# Patient Record
Sex: Female | Born: 1990 | Race: White | Hispanic: No | Marital: Single | Smoking: Never smoker
Health system: Southern US, Community
[De-identification: ages and names within clinical notes are randomized; demographics above are authoritative.]

---

## 2009-06-19 HISTORY — PX: CLAVICLE SURGERY: SHX598

## 2010-05-13 ENCOUNTER — Ambulatory Visit: Payer: Self-pay | Admitting: Family Medicine

## 2014-12-14 ENCOUNTER — Encounter (HOSPITAL_COMMUNITY): Payer: Self-pay | Admitting: Emergency Medicine

## 2014-12-14 ENCOUNTER — Other Ambulatory Visit (HOSPITAL_COMMUNITY): Payer: Self-pay | Admitting: Orthopaedic Surgery

## 2014-12-14 ENCOUNTER — Ambulatory Visit (HOSPITAL_COMMUNITY)
Admission: RE | Admit: 2014-12-14 | Discharge: 2014-12-14 | Disposition: A | Discharge status: Home / Self Care | Payer: 59 | Source: Ambulatory Visit | Attending: Orthopaedic Surgery | Admitting: Orthopaedic Surgery

## 2014-12-14 ENCOUNTER — Emergency Department (HOSPITAL_COMMUNITY)
Admission: EM | Admit: 2014-12-14 | Discharge: 2014-12-14 | Disposition: A | Discharge status: Home / Self Care | Payer: 59 | Attending: Emergency Medicine | Admitting: Emergency Medicine

## 2014-12-14 DIAGNOSIS — M7989 Other specified soft tissue disorders: Secondary | ICD-10-CM | POA: Diagnosis not present

## 2014-12-14 DIAGNOSIS — Y9289 Other specified places as the place of occurrence of the external cause: Secondary | ICD-10-CM | POA: Insufficient documentation

## 2014-12-14 DIAGNOSIS — Y9301 Activity, walking, marching and hiking: Secondary | ICD-10-CM | POA: Insufficient documentation

## 2014-12-14 DIAGNOSIS — R609 Edema, unspecified: Secondary | ICD-10-CM

## 2014-12-14 DIAGNOSIS — S4992XA Unspecified injury of left shoulder and upper arm, initial encounter: Secondary | ICD-10-CM | POA: Insufficient documentation

## 2014-12-14 DIAGNOSIS — S59902A Unspecified injury of left elbow, initial encounter: Secondary | ICD-10-CM | POA: Insufficient documentation

## 2014-12-14 DIAGNOSIS — W01198A Fall on same level from slipping, tripping and stumbling with subsequent striking against other object, initial encounter: Secondary | ICD-10-CM | POA: Insufficient documentation

## 2014-12-14 DIAGNOSIS — Y998 Other external cause status: Secondary | ICD-10-CM | POA: Diagnosis not present

## 2014-12-14 MED ORDER — NAPROXEN 500 MG PO TABS: 500.0000 mg | ORAL_TABLET | Freq: Two times a day (BID) | ORAL | Status: AC

## 2014-12-14 NOTE — ED Provider Notes (Signed)
CSN: [id number]     Arrival date & time 12/14/14  0007 History   First MD Initiated Contact with Patient 12/14/14 0241     Chief Complaint  Patient presents with  . Arm Swelling     (Consider location/radiation/quality/duration/timing/severity/associated sxs/prior Treatment) Patient is a 24 y.o. female presenting with arm injury. The history is provided by the patient. No language interpreter was used.  Arm Injury Location:  Arm Arm location:  L upper arm Associated symptoms: no fever   Associated symptoms comment:  Presents with painless swelling of left upper arm and elbow that started around 10:00 pm tonight. She went hiking today at around 4:30 and reports that she slipped while on rock and hit her left elbow. She denies significant pain at that time and completed her hike. She continues to be comfortable without any discomfort in the arm but became concerned with the onset of moderate swelling.    No past medical history on file. Past Surgical History  Procedure Laterality Date  . Clavicle surgery Left 2011   No family history on file. History  Substance Use Topics  . Smoking status: Never Smoker   . Smokeless tobacco: Not on file  . Alcohol Use: Yes     Comment: rarely   OB History    No data available     Review of Systems  Constitutional: Negative for fever and chills.  Respiratory: Negative.  Negative for shortness of breath.   Cardiovascular: Negative.  Negative for chest pain.  Musculoskeletal:       See HPi.  Skin: Negative.  Negative for wound.  Neurological: Negative.  Negative for weakness and numbness.      Allergies  Review of patient's allergies indicates no known allergies.  Home Medications   Prior to Admission medications   Medication Sig Start Date End Date Taking? Authorizing Provider  dexamethasone (DECADRON) 10 MG/ML injection Inject 50 mg into the vein once.   Yes Historical Provider, MD   BP 130/63 mmHg  Pulse 70  Temp(Src) 97.8 F  (36.6 C) (Oral)  Resp 20  SpO2 100%  LMP 12/04/2014 (Approximate) Physical Exam  Constitutional: She is oriented to person, place, and time. She appears well-developed and well-nourished.  Neck: Normal range of motion.  Cardiovascular: Intact distal pulses.   Pulmonary/Chest: Effort normal.  Musculoskeletal:  Left upper arm moderately uniformly swollen without discoloration. There is an effusion of the posterior elbow without olecranon tenderness. Swelling continues into the posterior forearm with a clearly demarcated border just distal to the elbow. There is a full, pain-free ROM of all joints of the left UE with full muscle strength.  Neurological: She is alert and oriented to person, place, and time.  Skin: Skin is warm and dry.    ED Course  Procedures (including critical care time) Labs Review Labs Reviewed - No data to display  Imaging Review No results found.   EKG Interpretation None      MDM   Final diagnoses:  None    1. Left arm swelling  DDx: ligamentous injury/rupture vs DVT vs contusion.  Doubt ligamentous injury, rupture or DVT without pain. Bedside ultrasound performed without evidence full rupture of ligament or tendon. Will refer to orthopedics for further management and evaluation.    Elpidio Anis, PA-C 12/14/14 4287  Derwood Kaplan, MD 12/15/14 6811

## 2014-12-14 NOTE — Discharge Instructions (Signed)
CALL DR. [facility]esXU'S OFFICE AND SCHEDULE AN APPOINTMENT THIS WEEK FOR FURTHER EVALUATION AND TREATMENT OF LEFT ARM SWELLING. RETURN TO THE ED WITH ANY PAIN, FEVER OR NEW CONCERN.

## 2014-12-14 NOTE — Progress Notes (Signed)
VASCULAR LAB PRELIMINARY  PRELIMINARY  PRELIMINARY  PRELIMINARY  Bilateral upper extremity venous Dopplers completed.    Preliminary report:  There is no DVT or SVT noted in the bilateral upper extremities.  There is significant interstitial fluid noted in the bilateral upper extremities, etiology unknown.  Darren Caldron, RVT 12/14/2014, 6:01 PM

## 2014-12-14 NOTE — ED Notes (Signed)
Pt states that she started noticing swelling in her L upper arm approx. 40 mins ago. States she fell today and hit the elbow but only minimally. Alert and oriented.

## 2014-12-16 ENCOUNTER — Ambulatory Visit (HOSPITAL_COMMUNITY)
Admission: RE | Admit: 2014-12-16 | Discharge: 2014-12-16 | Disposition: A | Discharge status: Home / Self Care | Payer: 59 | Source: Ambulatory Visit | Attending: Vascular Surgery | Admitting: Vascular Surgery

## 2014-12-16 ENCOUNTER — Other Ambulatory Visit: Payer: Self-pay | Admitting: *Deleted

## 2014-12-16 ENCOUNTER — Encounter (HOSPITAL_COMMUNITY): Payer: Self-pay

## 2014-12-16 ENCOUNTER — Encounter: Payer: Self-pay | Admitting: Vascular Surgery

## 2014-12-16 ENCOUNTER — Ambulatory Visit (INDEPENDENT_AMBULATORY_CARE_PROVIDER_SITE_OTHER): Payer: 59 | Admitting: Vascular Surgery

## 2014-12-16 VITALS — BP 121/80 | HR 72 | Resp 14 | Ht 67.0 in | Wt 154.0 lb

## 2014-12-16 DIAGNOSIS — M7989 Other specified soft tissue disorders: Secondary | ICD-10-CM | POA: Diagnosis not present

## 2014-12-16 DIAGNOSIS — R609 Edema, unspecified: Secondary | ICD-10-CM

## 2014-12-16 DIAGNOSIS — R2233 Localized swelling, mass and lump, upper limb, bilateral: Secondary | ICD-10-CM | POA: Diagnosis not present

## 2014-12-16 MED ORDER — IOHEXOL 300 MG/ML  SOLN
100.0000 mL | Freq: Once | INTRAMUSCULAR | Status: AC | PRN
  Administered 2014-12-16: 80 mL via INTRAVENOUS

## 2014-12-16 NOTE — Progress Notes (Addendum)
Referred by:  ED   Reason for referral: L>R arm swelling  History of Present Illness  Holly Mayer is a 24 y.o. (04/13/1991) female who presents with chief complaint: L>R arm swelling.  Pt had onset of swelling on Sunday after hiking.  She does not some trauma from a fall.  The swelling has continued since then but now has developed some swelling R arm also.  Patient notes the swelling started proximally then progressed distally.  Patient denies exposures to any new allergen.  She in fact took Bendaryl thinking she was having an allergic reaction.  The patient has never had signs or sx consistent with thrombophilia.  There is no family history of such.  She is not a smoker and is not taking BCP.  She recently saw an Orthopedist without any obvious diagnosis  Past Medical History: Left clavicle fracture  Past Surgical History  Procedure Laterality Date  . Clavicle surgery Left 2011    History   Social History  . Marital Status: Single    Spouse Name: N/A  . Number of Children: N/A  . Years of Education: N/A   Occupational History  . Not on file.   Social History Main Topics  . Smoking status: Never Smoker   . Smokeless tobacco: Never Used  . Alcohol Use: 0.0 oz/week    0 Standard drinks or equivalent per week     Comment: rarely  . Drug Use: No  . Sexual Activity: Not on file   Other Topics Concern  . Not on file   Social History Narrative    Family History: denied any significant family medical problems  Current Outpatient Prescriptions  Medication Sig Dispense Refill  . aspirin 325 MG EC tablet Take 325 mg by mouth daily.    Marland Kitchen dexamethasone (DECADRON) 10 MG/ML injection Inject 50 mg into the vein once.    . naproxen (NAPROSYN) 500 MG tablet Take 1 tablet (500 mg total) by mouth 2 (two) times daily. (Patient not taking: Reported on 12/16/2014) 30 tablet 0   No current facility-administered medications for this visit.     No Known Allergies   REVIEW OF  SYSTEMS:  (Positives checked otherwise negative)  CARDIOVASCULAR:  []  chest pain, []  chest pressure, []  palpitations, []  shortness of breath when laying flat, []  shortness of breath with exertion,  []  pain in feet when walking, []  pain in feet when laying flat, []  history of blood clot in veins (DVT), []  history of phlebitis, [x]  swelling in arms, []  varicose veins  PULMONARY:  []  productive cough, []  asthma, []  wheezing  NEUROLOGIC:  []  weakness in arms or legs, []  numbness in arms or legs, []  difficulty speaking or slurred speech, []  temporary loss of vision in one eye, []  dizziness  HEMATOLOGIC:  []  bleeding problems, []  problems with blood clotting too easily  MUSCULOSKEL:  []  joint pain, []  joint swelling  GASTROINTEST:  []  vomiting blood, []  blood in stool     GENITOURINARY:  []  burning with urination, []  blood in urine  PSYCHIATRIC:  []  history of major depression  INTEGUMENTARY:  []  rashes, []  ulcers  CONSTITUTIONAL:  []  fever, []  chills   Physical Examination  Filed Vitals:   12/16/14 1221 12/16/14 1223  BP: 115/75 121/80  Pulse: 60 72  Resp: 14   Height: 5\' 7"  (1.702 m)   Weight: 154 lb (69.854 kg)     Body mass index is 24.11 kg/(m^2).  General: A&O x 3, WDWN  Head:  [place]/AT  Ear/Nose/Throat: Hearing grossly intact, nares w/o erythema or drainage, oropharynx w/o Erythema/Exudate, Mallampati score: 2  Eyes: PERRLA, EOMI  Neck: Supple, no nuchal rigidity, no palpable LAD  Pulmonary: Sym exp, good air movt, CTAB, no rales, rhonchi, & wheezing  Cardiac: RRR, Nl S1, S2, no Murmurs, rubs or gallops  Vascular: Vessel Right Left  Radial Palpable Faintly Palpable  Ulnar Faintly Palpable Faintly Palpable  Brachial Palpable Palpable  Carotid Palpable, without bruit Palpable, without bruit  Aorta Not palpable N/A  Femoral Palpable Palpable  Popliteal Not palpable Not palpable  PT  Palpable  Palpable  DP  Palpable  Palpable   Gastrointestinal: soft, NTND,  -G/R, - HSM, - masses, - CVAT B  Musculoskeletal: M/S 5/5 throughout , Extremities without ischemic changes , L>>R edema, distal > proximal, markedly increased skin turgor in L forearm  Neurologic: CN 2-12 intact , Pain and light touch intact in extremities , Motor exam as listed above  Psychiatric: Judgment intact, Mood & affect appropriate for pt's clinical situation  Dermatologic: See M/S exam for extremity exam, no rashes otherwise noted  Lymph : No Cervical, Axillary, or Inguinal lymphadenopathy    Non-Invasive Vascular Imaging  BUE Venous duplex (12/14/14): preliminary negative,  Repeat BUE Venous duplex (12/14/14):   RUE: normal  LUE: proximal waveforms suggestive of possible proximal stenosis vs occlusion  Medical Decision Making  Holly Mayer is a 24 y.o. female who presents with: L>>R arm swelling.   Distribution of swelling not consistent with central venous stenosis vs occlusion.  Also bilaterality not consistent with obvious etiology.  No good reason for a healthy 24 years to have central venous stenosis or occlusion.  Story is not consistent with effort thrombosis.  Prior left clavicular injury might predispose to mechanism of left venous outflow compression but should not influence right arm.  Evaluating the prior venous duplex suggests loss of phasic flow in left axillary vein.  Will repeat BUE venous studies today.  CT Venous Chest may need be done to evaluate central venous structures.  Well's score only 3 without PE signs and sx, so I doubt any value to CTA chest at this point.  Thank you for allowing [place] to participate in this patient's care.  Leonides Sake, MD [facility] Office: [phone] Pager: [phone]  12/16/2014, 12:43 PM  Addendum  Repeat venous duplex consistent with possible left-sided central stenosis vs occlusion.  In this situation, I think a CT Chest with venous phase image would be helpful.  I  don't think a L arm venogram would be adequate to evaluate for extrinsic venous compression.  Pt was sent here for consideration of TOS but hx is not consistent with such.  Additionally, TOS is a diagnosis of exclusion.  Will need to see what the CTV shows prior to deciding on the next step.   Leonides Sake, MD [facility] Office: [phone] Pager: [phone]  12/16/2014, 3:25 PM  Addendum  Radiology: Ct Chest W Contrast  12/16/2014   ADDENDUM REPORT: 12/16/2014 17:02  ADDENDUM: Results were subsequently discussed with Dr. Imogene Burn. The patient has already had a Doppler ultrasound which demonstrated no DVT.   Electronically Signed   By: Carey Bullocks M.D.   On: 12/16/2014 17:02   12/16/2014   CLINICAL DATA:  Progressive bilateral arm swelling for 3 days. Evaluate for possible subclavian vein anomaly or thrombosis. Initial encounter.  EXAM: CT CHEST WITH CONTRAST  TECHNIQUE: Multidetector CT imaging of the chest  was performed during intravenous contrast administration.  CONTRAST:  80mL OMNIPAQUE IOHEXOL 300 MG/ML  SOLN  COMPARISON:  None.  FINDINGS: Mediastinum/Nodes: There are no enlarged mediastinal, hilar or axillary lymph nodes.There is normal thymic tissue within the anterior mediastinum. The thyroid gland, trachea and esophagus demonstrate no significant findings. The heart size is normal. There is no pericardial effusion.The study was not performed as a CTA. However, the SVC, brachiocephalic veins and subclavian veins appear unremarkable. No arterial abnormalities identified.  Lungs/Pleura: There is no pleural effusion. The lungs are clear.  Upper abdomen:  Unremarkable.  There is no adrenal mass.  Musculoskeletal/Chest wall: There is no chest wall mass or suspicious osseous finding. There is possible mild asymmetric edema within the left axilla. No focal fluid collection or mass lesion identified. No evidence of cervical ribs.  IMPRESSION: 1. No central venous  abnormality, subclavian vein DVT or definite signs of thoracic outlet syndrome identified on routine chest imaging. If DVT remains a clinical concern, consider Doppler evaluation. 2. Possible mild asymmetric subcutaneous edema within the left axilla, nonspecific. No evidence of axillary adenopathy, mass or focal fluid collection. 3. The heart, lungs and mediastinum appear unremarkable. 4. I attempted to call the report to Dr. Imogene Burnhen as requested, but reached voicemail. A message with a call back number was left.  Electronically Signed: By: Carey BullocksWilliam  Veazey M.D. On: 12/16/2014 16:45   I reviewed the CT.  There is no evidence of SVC thrombus or stenosis.  L innominate/SCV appear patent without obvious thrombus.  There is again no obvious extrinsic compression.  - I discussed the findings with the patient. - We discussed conservative measures to manage the swollen left arm: elevation and compression of left arm with an ACE wrap. - The patient is going out of country [place] 2 [place] and would like to complete her work-up prior to to leaving. - We discussed the only remaining evaluation would be a L arm venogram, which would definitely rule in/out a non-occlusive DVT in L SCV/axillary vein. -  She would like to proceed.  I have her scheduled for this coming Tuesday.  She will cancel if the swelling resolves before then.    Leonides SakeBrian Janesa Dockery, MD [facility] Office: [phone] Pager: [phone]  12/16/2014, 5:11 PM

## 2014-12-17 ENCOUNTER — Other Ambulatory Visit (HOSPITAL_COMMUNITY): Payer: Self-pay | Admitting: Family Medicine

## 2014-12-17 ENCOUNTER — Other Ambulatory Visit: Payer: Self-pay

## 2014-12-17 DIAGNOSIS — R748 Abnormal levels of other serum enzymes: Secondary | ICD-10-CM

## 2014-12-22 ENCOUNTER — Ambulatory Visit (HOSPITAL_COMMUNITY)
Admission: RE | Admit: 2014-12-22 | Discharge: 2014-12-22 | Disposition: A | Discharge status: Home / Self Care | Payer: 59 | Source: Ambulatory Visit | Attending: Family Medicine | Admitting: Family Medicine

## 2014-12-22 DIAGNOSIS — R7989 Other specified abnormal findings of blood chemistry: Secondary | ICD-10-CM | POA: Insufficient documentation

## 2014-12-22 DIAGNOSIS — R748 Abnormal levels of other serum enzymes: Secondary | ICD-10-CM

## 2014-12-23 ENCOUNTER — Encounter: Payer: Self-pay | Admitting: Orthopaedic Surgery

## 2014-12-24 ENCOUNTER — Encounter (HOSPITAL_COMMUNITY): Payer: Self-pay | Admitting: Vascular Surgery

## 2014-12-24 ENCOUNTER — Encounter (HOSPITAL_COMMUNITY)
Admission: RE | Disposition: A | Discharge status: Home / Self Care | Payer: Self-pay | Source: Ambulatory Visit | Attending: Vascular Surgery

## 2014-12-24 ENCOUNTER — Ambulatory Visit (HOSPITAL_COMMUNITY)
Admission: RE | Admit: 2014-12-24 | Discharge: 2014-12-24 | Disposition: A | Discharge status: Home / Self Care | Payer: 59 | Source: Ambulatory Visit | Attending: Vascular Surgery | Admitting: Vascular Surgery

## 2014-12-24 DIAGNOSIS — Z7982 Long term (current) use of aspirin: Secondary | ICD-10-CM | POA: Insufficient documentation

## 2014-12-24 DIAGNOSIS — M7989 Other specified soft tissue disorders: Secondary | ICD-10-CM | POA: Diagnosis not present

## 2014-12-24 DIAGNOSIS — I871 Compression of vein: Secondary | ICD-10-CM | POA: Diagnosis not present

## 2014-12-24 DIAGNOSIS — R948 Abnormal results of function studies of other organs and systems: Secondary | ICD-10-CM | POA: Diagnosis not present

## 2014-12-24 HISTORY — PX: PERIPHERAL VASCULAR CATHETERIZATION: SHX172C

## 2014-12-24 LAB — POCT I-STAT, CHEM 8
BUN: 20 mg/dL (ref 6–20)
CREATININE: 0.9 mg/dL (ref 0.44–1.00)
Calcium, Ion: 1.17 mmol/L (ref 1.12–1.23)
Chloride: 104 mmol/L (ref 101–111)
Glucose, Bld: 92 mg/dL (ref 65–99)
HCT: 44 % (ref 36.0–46.0)
Hemoglobin: 15 g/dL (ref 12.0–15.0)
Potassium: 3.7 mmol/L (ref 3.5–5.1)
Sodium: 140 mmol/L (ref 135–145)
TCO2: 23 mmol/L (ref 0–100)

## 2014-12-24 LAB — PREGNANCY, URINE: Preg Test, Ur: NEGATIVE

## 2014-12-24 SURGERY — UPPER EXTREMITY VENOGRAPHY
Anesthesia: LOCAL

## 2014-12-24 MED ORDER — LIDOCAINE HCL (PF) 1 % IJ SOLN
INTRAMUSCULAR | Status: AC
  Filled 2014-12-24: qty 30

## 2014-12-24 MED ORDER — HEPARIN (PORCINE) IN NACL 2-0.9 UNIT/ML-% IJ SOLN
INTRAMUSCULAR | Status: AC
  Filled 2014-12-24: qty 500

## 2014-12-24 MED ORDER — SODIUM CHLORIDE 0.9 % IV SOLN: INTRAVENOUS | Status: DC

## 2014-12-24 MED ORDER — VASOPRESSIN 20 UNIT/ML IV SOLN
INTRAVENOUS | Status: AC
  Filled 2014-12-24: qty 1

## 2014-12-24 MED ORDER — SODIUM CHLORIDE 0.9 % IV SOLN: 1.0000 mL/kg/h | INTRAVENOUS | Status: DC

## 2014-12-24 MED ORDER — ACETAMINOPHEN 325 MG PO TABS: 650.0000 mg | ORAL_TABLET | ORAL | Status: DC | PRN

## 2014-12-24 SURGICAL SUPPLY — 3 items
PROTECTION STATION PRESSURIZED (MISCELLANEOUS) ×2
STATION PROTECTION PRESSURIZED (MISCELLANEOUS) ×1 IMPLANT
STOPCOCK MORSE 400PSI 3WAY (MISCELLANEOUS) ×2 IMPLANT

## 2014-12-24 NOTE — Interval H&P Note (Signed)
History and Physical Interval Note:  12/24/2014 7:14 AM  Holly Mayer  has presented today for surgery, with the diagnosis of left arm swelling  The various methods of treatment have been discussed with the patient and family. After consideration of risks, benefits and other options for treatment, the patient has consented to  Procedure(s): Upper Extremity Venography (N/A) as a surgical intervention .  The patient's history has been reviewed, patient examined, no change in status, stable for surgery.  I have reviewed the patient's chart and labs.  Questions were answered to the patient's satisfaction.     Leonides Sakehen, Maylene Crocker

## 2014-12-24 NOTE — Discharge Instructions (Signed)
Venogram, Care After °Refer to this sheet in the next few weeks. These instructions provide you with information on caring for yourself after your procedure. Your health care provider may also give you more specific instructions. Your treatment has been planned according to current medical practices, but problems sometimes occur. Call your health care provider if you have any problems or questions after your procedure. °WHAT TO EXPECT AFTER THE PROCEDURE °After your procedure, it is typical to have the following sensations: °· Mild discomfort at the catheter insertion site. °HOME CARE INSTRUCTIONS  °· Take all medicines exactly as directed. °· Follow any prescribed diet. °· Follow instructions regarding both rest and physical activity. °· Drink more fluids for the first several days after the procedure in order to help flush dye from your kidneys. °SEEK MEDICAL CARE IF: °· You develop a rash. °· You have fever not controlled by medicine. °SEEK IMMEDIATE MEDICAL CARE IF: °· There is pain, drainage, bleeding, redness, swelling, warmth or a red streak at the site of the IV tube. °· The extremity where your IV tube was placed becomes discolored, numb, or cool. °· You have difficulty breathing or shortness of breath. °· You develop chest pain. °· You have excessive dizziness or fainting. °Document Released: 03/26/2013 Document Revised: 06/10/2013 Document Reviewed: 03/26/2013 °[employer]® Patient Information ©2015 [employer]. This information is not intended to replace advice given to you by your health care provider. Make sure you discuss any questions you have with your health care provider. ° °

## 2014-12-24 NOTE — H&P (View-Only) (Signed)
Referred by:  ED   Reason for referral: L>R arm swelling  History of Present Illness  Holly Mayer is a 24 y.o. (04/13/1991) female who presents with chief complaint: L>R arm swelling.  Pt had onset of swelling on Sunday after hiking.  She does not some trauma from a fall.  The swelling has continued since then but now has developed some swelling R arm also.  Patient notes the swelling started proximally then progressed distally.  Patient denies exposures to any new allergen.  She in fact took Bendaryl thinking she was having an allergic reaction.  The patient has never had signs or sx consistent with thrombophilia.  There is no family history of such.  She is not a smoker and is not taking BCP.  She recently saw an Orthopedist without any obvious diagnosis  Past Medical History: Left clavicle fracture  Past Surgical History  Procedure Laterality Date  . Clavicle surgery Left 2011    History   Social History  . Marital Status: Single    Spouse Name: N/A  . Number of Children: N/A  . Years of Education: N/A   Occupational History  . Not on file.   Social History Main Topics  . Smoking status: Never Smoker   . Smokeless tobacco: Never Used  . Alcohol Use: 0.0 oz/week    0 Standard drinks or equivalent per week     Comment: rarely  . Drug Use: No  . Sexual Activity: Not on file   Other Topics Concern  . Not on file   Social History Narrative    Family History: denied any significant family medical problems  Current Outpatient Prescriptions  Medication Sig Dispense Refill  . aspirin 325 MG EC tablet Take 325 mg by mouth daily.    Marland Kitchen dexamethasone (DECADRON) 10 MG/ML injection Inject 50 mg into the vein once.    . naproxen (NAPROSYN) 500 MG tablet Take 1 tablet (500 mg total) by mouth 2 (two) times daily. (Patient not taking: Reported on 12/16/2014) 30 tablet 0   No current facility-administered medications for this visit.     No Known Allergies   REVIEW OF  SYSTEMS:  (Positives checked otherwise negative)  CARDIOVASCULAR:  []  chest pain, []  chest pressure, []  palpitations, []  shortness of breath when laying flat, []  shortness of breath with exertion,  []  pain in feet when walking, []  pain in feet when laying flat, []  history of blood clot in veins (DVT), []  history of phlebitis, [x]  swelling in arms, []  varicose veins  PULMONARY:  []  productive cough, []  asthma, []  wheezing  NEUROLOGIC:  []  weakness in arms or legs, []  numbness in arms or legs, []  difficulty speaking or slurred speech, []  temporary loss of vision in one eye, []  dizziness  HEMATOLOGIC:  []  bleeding problems, []  problems with blood clotting too easily  MUSCULOSKEL:  []  joint pain, []  joint swelling  GASTROINTEST:  []  vomiting blood, []  blood in stool     GENITOURINARY:  []  burning with urination, []  blood in urine  PSYCHIATRIC:  []  history of major depression  INTEGUMENTARY:  []  rashes, []  ulcers  CONSTITUTIONAL:  []  fever, []  chills   Physical Examination  Filed Vitals:   12/16/14 1221 12/16/14 1223  BP: 115/75 121/80  Pulse: 60 72  Resp: 14   Height: 5\' 7"  (1.702 m)   Weight: 154 lb (69.854 kg)     Body mass index is 24.11 kg/(m^2).  General: A&O x 3, WDWN  Head:  /AT  Ear/Nose/Throat: Hearing grossly intact, nares w/o erythema or drainage, oropharynx w/o Erythema/Exudate, Mallampati score: 2  Eyes: PERRLA, EOMI  Neck: Supple, no nuchal rigidity, no palpable LAD  Pulmonary: Sym exp, good air movt, CTAB, no rales, rhonchi, & wheezing  Cardiac: RRR, Nl S1, S2, no Murmurs, rubs or gallops  Vascular: Vessel Right Left  Radial Palpable Faintly Palpable  Ulnar Faintly Palpable Faintly Palpable  Brachial Palpable Palpable  Carotid Palpable, without bruit Palpable, without bruit  Aorta Not palpable N/A  Femoral Palpable Palpable  Popliteal Not palpable Not palpable  PT  Palpable  Palpable  DP  Palpable  Palpable   Gastrointestinal: soft, NTND,  -G/R, - HSM, - masses, - CVAT B  Musculoskeletal: M/S 5/5 throughout , Extremities without ischemic changes , L>>R edema, distal > proximal, markedly increased skin turgor in L forearm  Neurologic: CN 2-12 intact , Pain and light touch intact in extremities , Motor exam as listed above  Psychiatric: Judgment intact, Mood & affect appropriate for pt's clinical situation  Dermatologic: See M/S exam for extremity exam, no rashes otherwise noted  Lymph : No Cervical, Axillary, or Inguinal lymphadenopathy    Non-Invasive Vascular Imaging  BUE Venous duplex (12/14/14): preliminary negative,  Repeat BUE Venous duplex (12/14/14):   RUE: normal  LUE: proximal waveforms suggestive of possible proximal stenosis vs occlusion  Medical Decision Making  [name] is a 24 y.o. female who presents with: L>>R arm swelling.   Distribution of swelling not consistent with central venous stenosis vs occlusion.  Also bilaterality not consistent with obvious etiology.  No good reason for a healthy 24 years to have central venous stenosis or occlusion.  Story is not consistent with effort thrombosis.  Prior left clavicular injury might predispose to mechanism of left venous outflow compression but should not influence right arm.  Evaluating the prior venous duplex suggests loss of phasic flow in left axillary vein.  Will repeat BUE venous studies today.  CT Venous Chest may need be done to evaluate central venous structures.  Well's score only 3 without PE signs and sx, so I doubt any value to CTA chest at this point.  Thank you for allowing [place] to participate in this patient's care.  Leonides Sake, MD [facility] Office: [phone] Pager: [phone]  12/16/2014, 12:43 PM  Addendum  Repeat venous duplex consistent with possible left-sided central stenosis vs occlusion.  In this situation, I think a CT Chest with venous phase image would be helpful.  I  don't think a L arm venogram would be adequate to evaluate for extrinsic venous compression.  Pt was sent here for consideration of TOS but hx is not consistent with such.  Additionally, TOS is a diagnosis of exclusion.  Will need to see what the CTV shows prior to deciding on the next step.   Leonides Sake, MD [facility] Office: [phone] Pager: [phone]  12/16/2014, 3:25 PM  Addendum  Radiology: Ct Chest W Contrast  12/16/2014   ADDENDUM REPORT: 12/16/2014 17:02  ADDENDUM: Results were subsequently discussed with Dr. Imogene Burn. The patient has already had a Doppler ultrasound which demonstrated no DVT.   Electronically Signed   By: Carey Bullocks M.D.   On: 12/16/2014 17:02   12/16/2014   CLINICAL DATA:  Progressive bilateral arm swelling for 3 days. Evaluate for possible subclavian vein anomaly or thrombosis. Initial encounter.  EXAM: CT CHEST WITH CONTRAST  TECHNIQUE: Multidetector CT imaging of the chest  was performed during intravenous contrast administration.  CONTRAST:  80mL OMNIPAQUE IOHEXOL 300 MG/ML  SOLN  COMPARISON:  None.  FINDINGS: Mediastinum/Nodes: There are no enlarged mediastinal, hilar or axillary lymph nodes.There is normal thymic tissue within the anterior mediastinum. The thyroid gland, trachea and esophagus demonstrate no significant findings. The heart size is normal. There is no pericardial effusion.The study was not performed as a CTA. However, the SVC, brachiocephalic veins and subclavian veins appear unremarkable. No arterial abnormalities identified.  Lungs/Pleura: There is no pleural effusion. The lungs are clear.  Upper abdomen:  Unremarkable.  There is no adrenal mass.  Musculoskeletal/Chest wall: There is no chest wall mass or suspicious osseous finding. There is possible mild asymmetric edema within the left axilla. No focal fluid collection or mass lesion identified. No evidence of cervical ribs.  IMPRESSION: 1. No central venous  abnormality, subclavian vein DVT or definite signs of thoracic outlet syndrome identified on routine chest imaging. If DVT remains a clinical concern, consider Doppler evaluation. 2. Possible mild asymmetric subcutaneous edema within the left axilla, nonspecific. No evidence of axillary adenopathy, mass or focal fluid collection. 3. The heart, lungs and mediastinum appear unremarkable. 4. I attempted to call the report to Dr. Imogene Burnhen as requested, but reached voicemail. A message with a call back number was left.  Electronically Signed: By: Carey BullocksWilliam  Veazey M.D. On: 12/16/2014 16:45   I reviewed the CT.  There is no evidence of SVC thrombus or stenosis.  L innominate/SCV appear patent without obvious thrombus.  There is again no obvious extrinsic compression.  - I discussed the findings with the patient. - We discussed conservative measures to manage the swollen left arm: elevation and compression of left arm with an ACE wrap. - [place] going [place] of [place] 2 [place] and would like to complete her work-up prior to to leaving. - We discussed the only remaining evaluation would be a L arm venogram, which would definitely rule in/out a non-occlusive DVT in L SCV/axillary vein. -  She would like to proceed.  I have her scheduled for this coming Tuesday.  She will cancel if the swelling resolves before then.    Leonides SakeBrian Ayvion Kavanagh, MD [facility] Office: [phone] Pager: [phone]  12/16/2014, 5:11 PM

## 2014-12-25 ENCOUNTER — Telehealth: Payer: Self-pay | Admitting: Vascular Surgery

## 2014-12-25 NOTE — Telephone Encounter (Addendum)
-----   Message from Sharee PimpleMarilyn K McChesney, RN sent at 12/24/2014  9:12 AM EDT ----- Regarding: Schedule   ----- Message -----    From: Fransisco HertzBrian L Chen, MD    Sent: 12/24/2014   7:45 AM      To: [facility] [address]  Eleonore Chiquitoatalie N Boerner [id number] June 10, 1991  Procedure: L arm and central venogram  Follow-up: 4-6 weeks  notified patient of post op appt on 01-29-15 8:30 with dr. Imogene Burnchen

## 2015-01-29 ENCOUNTER — Ambulatory Visit: Payer: 59 | Admitting: Vascular Surgery

## 2016-08-17 IMAGING — CT CT CHEST W/ CM
2 of 5 series · 14 of 36 positions shown, 17 images · IV contrast (omnipaque)
Comparison: None.

ADDENDUM:
Results were subsequently discussed with Dr. Ghani. The patient has
already had a Doppler ultrasound which demonstrated no DVT.
CLINICAL DATA: Progressive bilateral arm swelling for 3 days.
Evaluate for possible subclavian vein anomaly or thrombosis. Initial
encounter.

EXAM:
CT CHEST WITH CONTRAST
TECHNIQUE: Multidetector CT imaging of the chest was performed during
intravenous contrast administration.
CONTRAST:  80mL OMNIPAQUE IOHEXOL 300 MG/ML  SOLN

[Series 4: chest with st 3's · axial · 0.78mm/px · z∈[-327,-36]mm · 11 of 117 slices shown, 14 images]
[im 10/117  mediastinal]
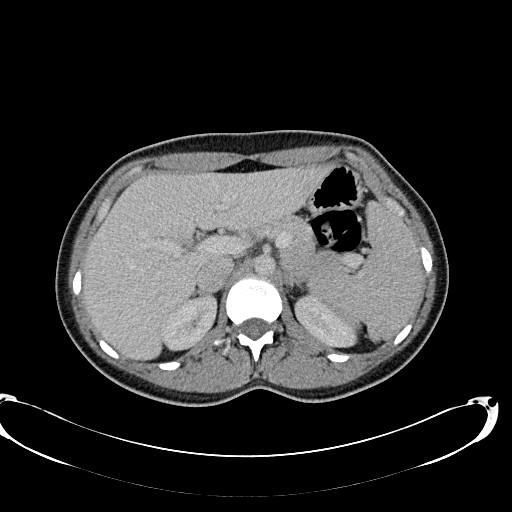
[im 10/117  lung]
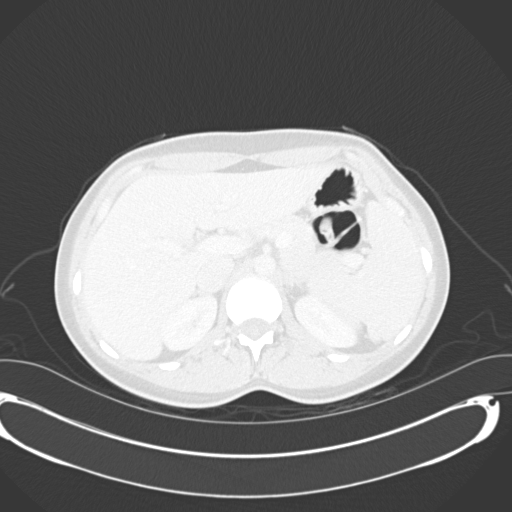
[im 20/117  lung]
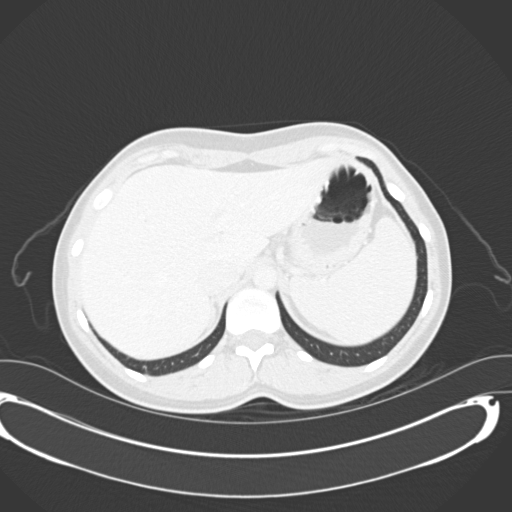
[im 30/117  lung]
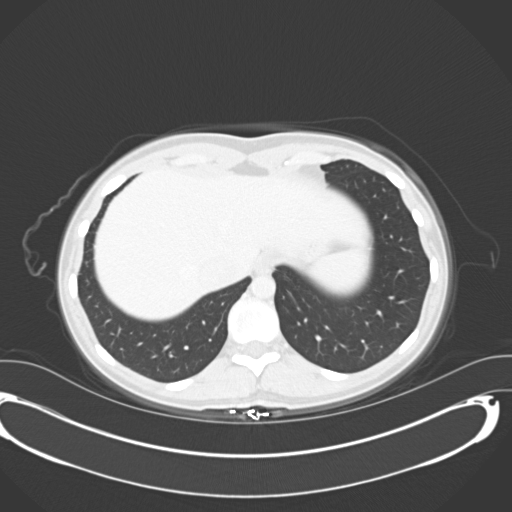
[im 39/117  lung]
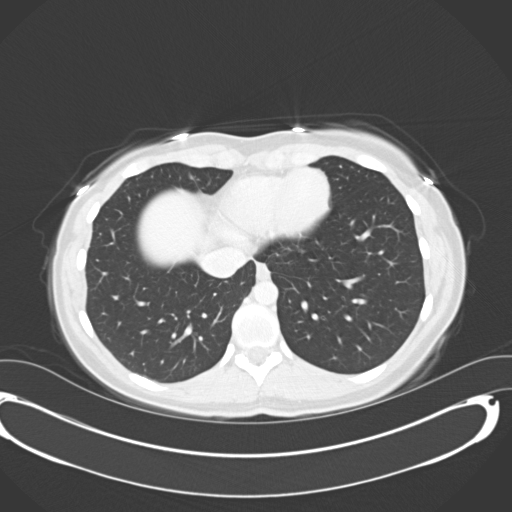
[im 49/117  mediastinal]
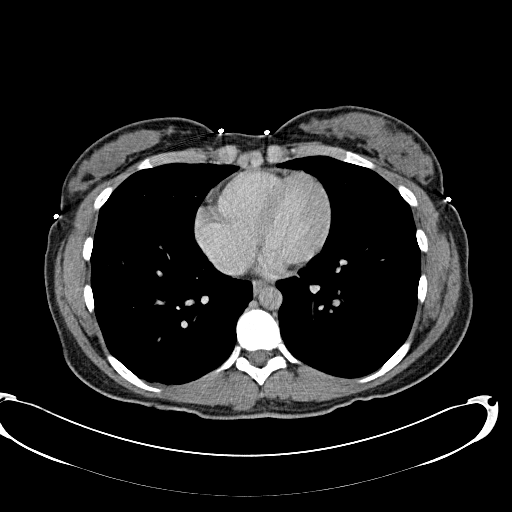
[im 49/117  lung]
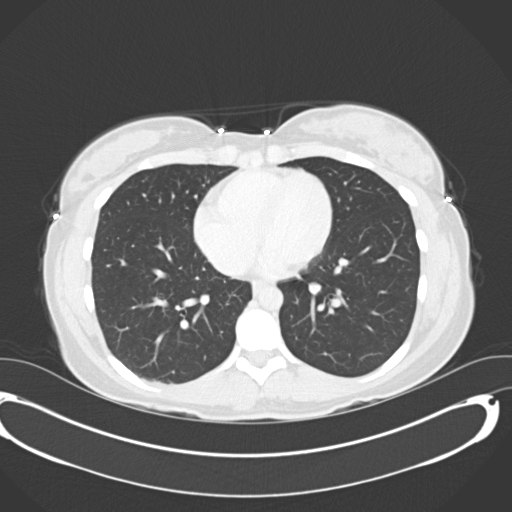
[im 59/117  lung]
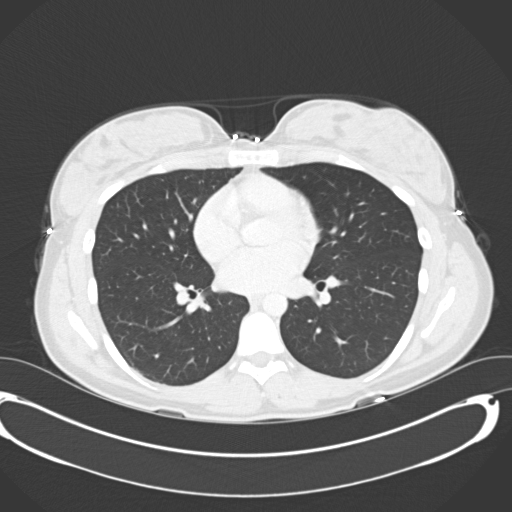
[im 68/117  lung]
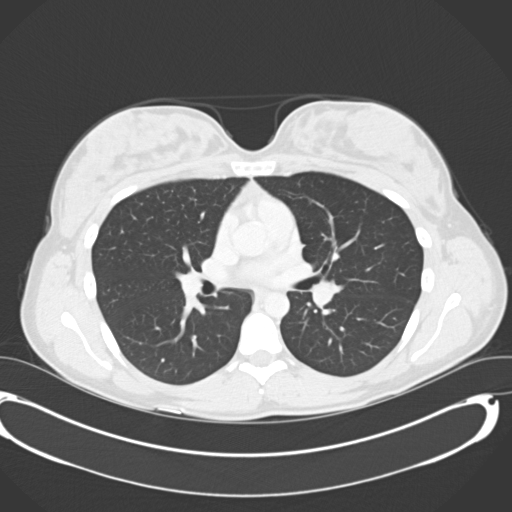
[im 78/117  lung]
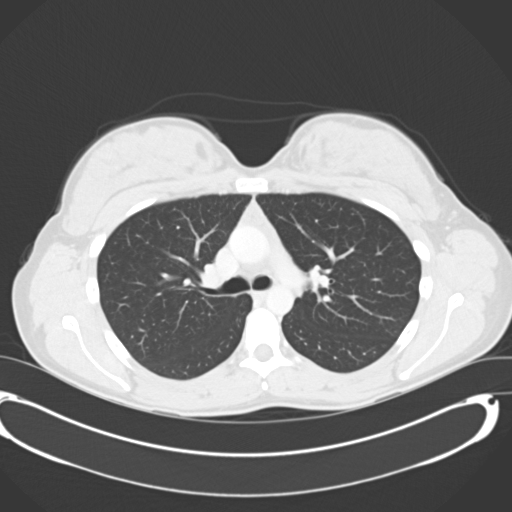
[im 88/117  mediastinal]
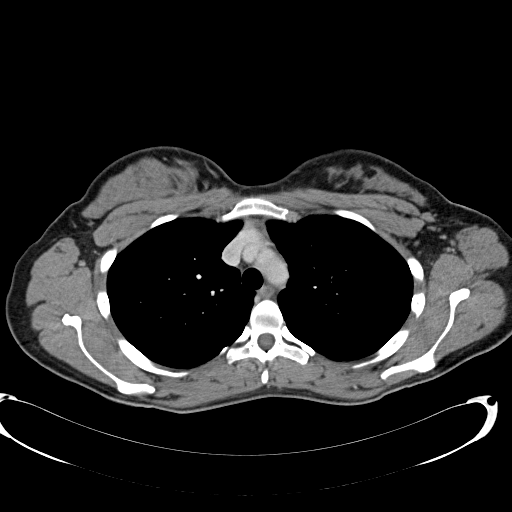
[im 88/117  lung]
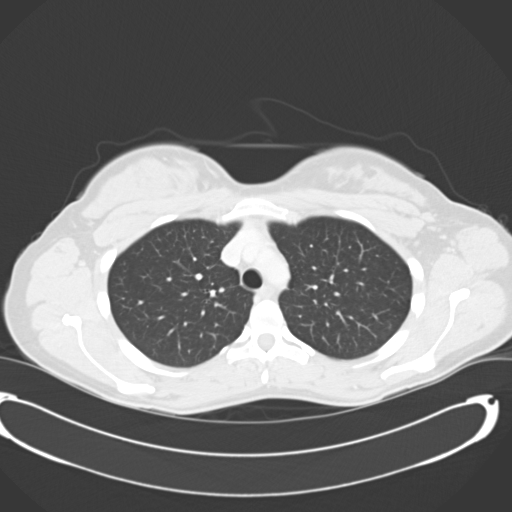
[im 97/117  lung]
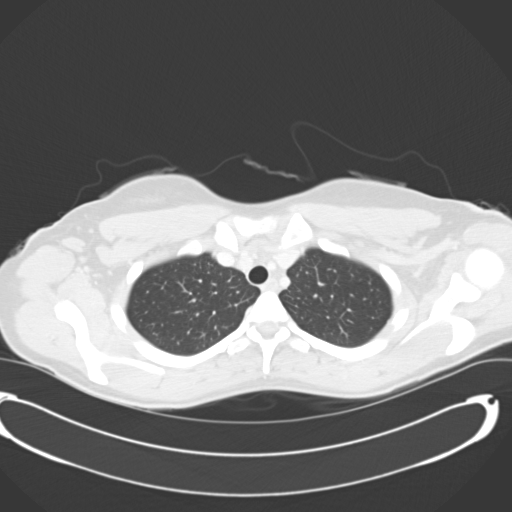
[im 107/117  lung]
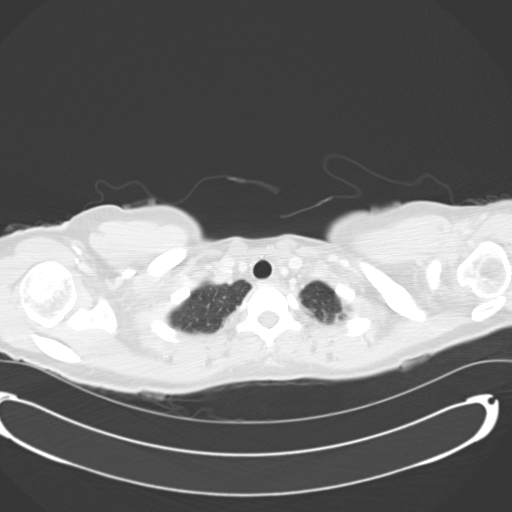

[Series 602: cor · coronal · 0.78mm/px · 3 of 71 slices shown]
[im 15/71  lung]
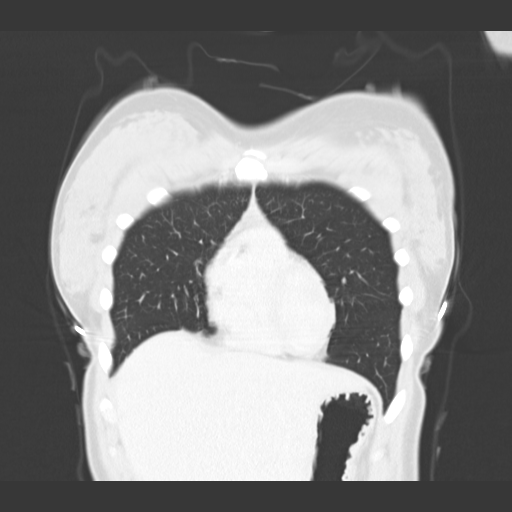
[im 29/71  lung]
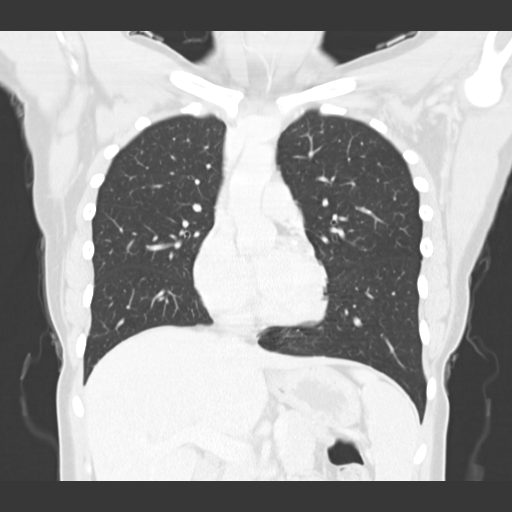
[im 43/71  lung]
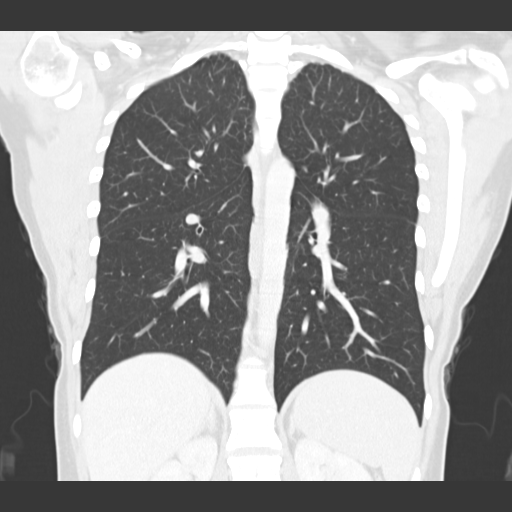

[14 of 36 positions shown; findings below may reference images not displayed]

FINDINGS: Mediastinum/Nodes: There are no enlarged mediastinal, hilar or
axillary lymph nodes.There is normal thymic tissue within the
anterior mediastinum. The thyroid gland, trachea and esophagus
demonstrate no significant findings. The heart size is normal. There
is no pericardial effusion.The study was not performed as a CTA.
However, the SVC, brachiocephalic veins and subclavian veins appear
unremarkable. No arterial abnormalities identified.

Lungs/Pleura: There is no pleural effusion. The lungs are clear.

Upper abdomen:  Unremarkable.  There is no adrenal mass.

Musculoskeletal/Chest wall: There is no chest wall mass or
suspicious osseous finding. There is possible mild asymmetric edema
within the left axilla. No focal fluid collection or mass lesion
identified. No evidence of cervical ribs.
IMPRESSION: 1. No central venous abnormality, subclavian vein DVT or definite
signs of thoracic outlet syndrome identified on routine chest
imaging. If DVT remains a clinical concern, consider Doppler
evaluation.
2. Possible mild asymmetric subcutaneous edema within the left
axilla, nonspecific. No evidence of axillary adenopathy, mass or
focal fluid collection.
3. The heart, lungs and mediastinum appear unremarkable.
4. I attempted to call the report to Dr. Ghani as requested, but
reached voicemail. A message with a call back number was left.
